# Patient Record
Sex: Female | Born: 2000 | Race: Black or African American | Hispanic: No | Marital: Single | Smoking: Never smoker
Health system: Southern US, Community
[De-identification: ages and names within clinical notes are randomized; demographics above are authoritative.]

## PROBLEM LIST (undated history)

## (undated) DIAGNOSIS — Z789 Other specified health status: Secondary | ICD-10-CM

## (undated) HISTORY — DX: Other specified health status: Z78.9

## (undated) HISTORY — PX: NO PAST SURGERIES: SHX2092

---
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
## 2022-08-31 ENCOUNTER — Ambulatory Visit (INDEPENDENT_AMBULATORY_CARE_PROVIDER_SITE_OTHER): Payer: Commercial Managed Care - HMO | Admitting: Obstetrics and Gynecology

## 2022-08-31 ENCOUNTER — Encounter: Payer: Self-pay | Admitting: Obstetrics and Gynecology

## 2022-08-31 ENCOUNTER — Other Ambulatory Visit (HOSPITAL_COMMUNITY)
Admission: RE | Admit: 2022-08-31 | Discharge: 2022-08-31 | Disposition: A | Discharge status: Home / Self Care | Payer: Commercial Managed Care - HMO | Source: Ambulatory Visit | Attending: Obstetrics and Gynecology | Admitting: Obstetrics and Gynecology

## 2022-08-31 VITALS — BP 118/74 | HR 63 | Ht 67.5 in | Wt 161.0 lb

## 2022-08-31 DIAGNOSIS — Z113 Encounter for screening for infections with a predominantly sexual mode of transmission: Secondary | ICD-10-CM

## 2022-08-31 DIAGNOSIS — N939 Abnormal uterine and vaginal bleeding, unspecified: Secondary | ICD-10-CM | POA: Diagnosis not present

## 2022-08-31 MED ORDER — NORETHIN ACE-ETH ESTRAD-FE 1-20 MG-MCG PO TABS: 1.0000 | ORAL_TABLET | Freq: Every day | ORAL | 3 refills | Status: AC

## 2022-08-31 MED ORDER — JUNEL FE 24 1-20 MG-MCG(24) PO TABS: 1.0000 | ORAL_TABLET | Freq: Every day | ORAL | 3 refills | Status: DC

## 2022-08-31 NOTE — Patient Instructions (Signed)
If everything is going well, send [place] a MyChart message to switch your appointment to a telemedicine (video) call.

## 2022-08-31 NOTE — Progress Notes (Signed)
   NEW GYNECOLOGY VISIT  Subjective:  Christine Long is a 21 y.o. G0 with LMP 08/23/22 presenting for vaginal bleeding on nexplanon.  Bothersome bleeding since getting nexplanon placed last year. Christine Long has 12-21d of bleeding with 2-4 weeks of no bleeding in between. Changes pads q2-3h in the first few days, then spaces as time goes on. No issues with bleeding prior to Nexplanon  Recently diagnosed with thrombocytopenia. I personally reviewed the note from Dr. Greggory Long on 02/23/22 and Dr. Grace Long on 08/24/22. I reviewed her CBC from 06/27/22. Platelets 83, Hgb 12.3.  Denies hx VTE, CVA, MI, age 35+ & smoking, uncontrolled HTN, SLE, or liver/gallbladder issues. No contraindications to estrogen.  Objective:   Vitals:   08/31/22 1010  BP: 118/74  Pulse: 63  Weight: 161 lb (73 kg)  Height: 5' 7.5" (1.715 m)   General:  Alert, oriented and cooperative. Patient is in no acute distress.  Skin: Skin is warm and dry. No rash noted.   Cardiovascular: Normal heart rate noted  Respiratory: Normal respiratory effort, no problems with respiration noted   Assessment and Plan:  Christine Long is a 21 y.o. with AUB-I from Nexplanon  AUB-I Discussed that this bleeding is likely iatrogenic and a known side effect of nexplanon Discussed options including estrogen add back with COCs and changing birth control methods. After discussion with patient and her mother, patient would like to try COCs - norethindrone-ethinyl estradiol-FE (JUNEL FE 1/20) 1-20 MG-MCG tablet; Take 1 tablet by mouth daily.  Dispense: 90 tablet; Refill: 3  2. Routine screening for STI (sexually transmitted infection) - Cervicovaginal ancillary only( Christine Long) - Hepatitis B surface antigen - Hepatitis C antibody - HIV Antibody (routine testing w rflx) - RPR  Return in about 3 months (around 11/30/2022).  No future appointments.  Christine Pall, MD

## 2022-08-31 NOTE — Progress Notes (Signed)
Irregular VB, lasting longer than usual, has heavy flow at baseline. Nexplanon since 05/2021 Depression and anxiety screen negative STI testing today

## 2022-09-01 ENCOUNTER — Other Ambulatory Visit: Payer: Self-pay

## 2022-09-01 DIAGNOSIS — B9689 Other specified bacterial agents as the cause of diseases classified elsewhere: Secondary | ICD-10-CM

## 2022-09-01 LAB — CERVICOVAGINAL ANCILLARY ONLY
Bacterial Vaginitis (gardnerella): POSITIVE — AB
Candida Glabrata: NEGATIVE
Candida Vaginitis: NEGATIVE
Chlamydia: NEGATIVE
Comment: NEGATIVE
Comment: NEGATIVE
Comment: NEGATIVE
Comment: NEGATIVE
Comment: NEGATIVE
Comment: NORMAL
Neisseria Gonorrhea: NEGATIVE
Trichomonas: NEGATIVE

## 2022-09-01 LAB — HIV ANTIBODY (ROUTINE TESTING W REFLEX): HIV Screen 4th Generation wRfx: NONREACTIVE

## 2022-09-01 LAB — RPR: RPR Ser Ql: NONREACTIVE

## 2022-09-01 LAB — HEPATITIS C ANTIBODY: Hep C Virus Ab: NONREACTIVE

## 2022-09-01 LAB — HEPATITIS B SURFACE ANTIGEN: Hepatitis B Surface Ag: NEGATIVE

## 2022-09-01 MED ORDER — METRONIDAZOLE 500 MG PO TABS: 500.0000 mg | ORAL_TABLET | Freq: Two times a day (BID) | ORAL | 0 refills | Status: DC

## 2023-03-30 ENCOUNTER — Encounter: Payer: Self-pay | Admitting: Obstetrics and Gynecology

## 2023-03-30 ENCOUNTER — Ambulatory Visit: Payer: Medicaid Other | Admitting: Obstetrics and Gynecology

## 2023-03-30 VITALS — BP 115/71 | HR 56 | Ht 67.5 in | Wt 167.0 lb

## 2023-03-30 DIAGNOSIS — Z975 Presence of (intrauterine) contraceptive device: Secondary | ICD-10-CM | POA: Diagnosis not present

## 2023-03-30 DIAGNOSIS — N921 Excessive and frequent menstruation with irregular cycle: Secondary | ICD-10-CM | POA: Diagnosis not present

## 2023-03-30 MED ORDER — DROSPIRENONE-ETHINYL ESTRADIOL 3-0.02 MG PO TABS: 1.0000 | ORAL_TABLET | Freq: Every day | ORAL | 11 refills | Status: AC

## 2023-03-30 NOTE — Progress Notes (Signed)
22 yo P0 returning for the management of irregular bleeding associated with Nexplanon. Patient was started in COC and reports worsening of her acne following initiation. She is not currently sexually active  Past Medical History:  Diagnosis Date   No pertinent past medical history    Past Surgical History:  Procedure Laterality Date   NO PAST SURGERIES     Family History  Problem Relation Age of Onset   Hypertension Mother    Social History   Tobacco Use   Smoking status: Never   Smokeless tobacco: Never  Vaping Use   Vaping status: Never Used  Substance Use Topics   Alcohol use: Never   Drug use: Never   ROS See pertinent in HPI. All other systems reviewed and non contributory Blood pressure 115/71, pulse (!) 56, height 5' 7.5" (1.715 m), weight 167 lb (75.8 kg), last menstrual period 02/04/2023. GENERAL: Well-developed, well-nourished female in no acute distress.  NEURO: alert and oriented x 3  A/P 22 yo P0 with irregular bleeding with nexplanon - Dicussed trying a different birth control pill such as yaz vs Nexplanon removal - Patient opted for trial of yaz.  - RTC as needed. Nexplanon due for removal 05/2024 - Patient is not sexually active and declined STI screening

## 2023-03-30 NOTE — Progress Notes (Signed)
Pt is in office to discuss irregular cycles. Pt states she will have cycle for a month then be off a month x approx a year.  Pt has Nexplanon in place since Sept 2022.

## 2024-05-03 ENCOUNTER — Inpatient Hospital Stay

## 2024-05-03 ENCOUNTER — Inpatient Hospital Stay: Attending: Hematology and Oncology | Admitting: Hematology and Oncology
